# Patient Record
Sex: Male | Born: 1955
Health system: Southern US, Community
[De-identification: ages and names within clinical notes are randomized; demographics above are authoritative.]

## PROBLEM LIST (undated history)

## (undated) DIAGNOSIS — Z860100 Personal history of colon polyps, unspecified: Secondary | ICD-10-CM

## (undated) DIAGNOSIS — T7840XA Allergy, unspecified, initial encounter: Secondary | ICD-10-CM

## (undated) HISTORY — DX: Personal history of colonic polyps: Z86.010

## (undated) HISTORY — DX: Personal history of colon polyps, unspecified: Z86.0100

## (undated) HISTORY — DX: Allergy, unspecified, initial encounter: T78.40XA

---

## 2008-04-25 HISTORY — PX: KNEE ARTHROSCOPY W/ ACL RECONSTRUCTION: SHX1858

## 2017-03-08 DIAGNOSIS — Z23 Encounter for immunization: Secondary | ICD-10-CM | POA: Diagnosis not present

## 2017-08-08 ENCOUNTER — Encounter: Payer: Self-pay | Admitting: Gastroenterology

## 2017-08-15 ENCOUNTER — Ambulatory Visit (INDEPENDENT_AMBULATORY_CARE_PROVIDER_SITE_OTHER): Payer: 59 | Admitting: Gastroenterology

## 2017-08-15 ENCOUNTER — Encounter: Payer: Self-pay | Admitting: Gastroenterology

## 2017-08-15 VITALS — BP 128/82 | HR 68 | Ht 74.0 in | Wt 280.0 lb

## 2017-08-15 DIAGNOSIS — Z1211 Encounter for screening for malignant neoplasm of colon: Secondary | ICD-10-CM | POA: Diagnosis not present

## 2017-08-15 DIAGNOSIS — K589 Irritable bowel syndrome without diarrhea: Secondary | ICD-10-CM | POA: Diagnosis not present

## 2017-08-15 DIAGNOSIS — Z86010 Personal history of colonic polyps: Secondary | ICD-10-CM

## 2017-08-15 MED ORDER — SOD PICOSULFATE-MAG OX-CIT ACD 10-3.5-12 MG-GM -GM/160ML PO SOLN: 1.0000 | Freq: Once | ORAL | 0 refills | Status: AC

## 2017-08-15 NOTE — Progress Notes (Signed)
Chief Complaint: For colorectal cancer screening  Referring Provider:  No ref. provider found      ASSESSMENT AND PLAN;  Colorectal cancer screening H/O polyps IBS with bloating, loud borborygmi and occ diarrhea, failed trial of probiotics  Plan -Proceed with colonoscopy.  I have discussed the risks and benefits.  The risks including risk of perforation requiring laparotomy, bleeding after polypectomy requiring blood transfusions and risks of anesthesia/sedation were discussed.  Rare risks of missing colorectal neoplasms were also discussed.  Alternatives were given.  Patient is fully aware and agrees to proceed. All the questions were answered. Colonoscopy will be scheduled in upcoming days.  Patient is to report immediately if there is any significant weight loss or excessive bleeding until then. Consent forms were given for review.  -Trial of lactose free and coffee free diet x 2 weeks, if still with problems, trial of gluten free diet x 2 weeks.  If still with problems, will give him a trial of Bentyl or Levsin.  I have reassured the patient.  -Obtain results-of last colonoscopy from [place] (awaiting record deconversion)     HPI:    71yr for colon cancer screening.  No nausea, vomiting, heartburn, regurgitation, odynophagia or dysphagia.  No significant diarrhea or constipation.  There is no melena or hematochezia. No unintentional weight loss.   Has been having "roaring" with "water rumbling" after eating intermittently. Lot of gas. 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 Happens at night as well. Has occ diarrhea. Tried probiotics but did not help. His normal bowel habits - 2/day. The loud borborygmi gets better with defecation.  He has been drinking significant coffee.  No sodas, chewing gums or chocolates.  No nausea, vomiting, heartburn, regurgitation, odynophagia or dysphagia.  No significant constipation.  There is no melena or  hematochezia. No unintentional weight loss.   Past Medical History:  Diagnosis Date  . History of colon polyps     History reviewed. No pertinent surgical history.  Family History  Problem Relation Age of Onset  . Colon cancer Neg Hx     Social History   Tobacco Use  . Smoking status: Never Smoker  . Smokeless tobacco: Never Used  Substance Use Topics  . Alcohol use: Never    Frequency: Never  . Drug use: Never    No current outpatient medications on file.   No current facility-administered medications for this visit.     No Known Allergies  Review of Systems:  Constitutional: Denies fever, chills, diaphoresis, appetite change and fatigue.  HEENT: Denies photophobia, eye pain, redness, hearing loss, ear pain, congestion, sore throat, rhinorrhea, sneezing, mouth sores, neck pain, neck stiffness and tinnitus.   Respiratory: Denies SOB, DOE, cough, chest tightness,  and wheezing.   Cardiovascular: Denies chest pain, palpitations and leg swelling.  Genitourinary: Denies dysuria, urgency, frequency, hematuria, flank pain and difficulty urinating.  Musculoskeletal: Denies myalgias, back pain, joint swelling, arthralgias and gait problem.  Skin: No rash. .  Neurological: Denies dizziness, seizures, syncope, weakness, light-headedness, numbness and headaches.  Hematological: Denies adenopathy. Easy bruising, personal or family bleeding history  Psychiatric/Behavioral: No anxiety or depression     Physical Exam:    BP 128/82   Pulse 68   Ht 6\' 2"  (1.88 m)   Wt 280 lb (127 kg)   BMI 35.95 kg/m  Filed Weights   08/15/17 1352  Weight: 280 lb (127 kg)   Constitutional:  Well-developed, in no  acute distress. Psychiatric: Normal mood and affect. Behavior is normal. HEENT: Pupils normal.  Conjunctivae are normal. No scleral icterus. Neck supple.  Cardiovascular: Normal rate, regular rhythm. No edema Pulmonary/chest: Effort normal and breath sounds normal. No wheezing,  rales or rhonchi. Abdominal: Soft, nondistended. Nontender. Bowel sounds active throughout. There are no masses palpable. No hepatomegaly. Rectal: At the time of colonoscopy Neurological: Alert and oriented to person place and time. Skin: Skin is warm and dry. No rashes noted.    Carmell Austria, MD   Cc: No ref. provider found

## 2017-08-15 NOTE — Patient Instructions (Signed)
If you are age 60 or older, your body mass index should be between 23-30. Your Body mass index is 35.95 kg/m. If this is out of the aforementioned range listed, please consider follow up with your Primary Care Provider.  If you are age 40 or younger, your body mass index should be between 19-25. Your Body mass index is 35.95 kg/m. If this is out of the aformentioned range listed, please consider follow up with your Primary Care Provider.   You have been scheduled for a colonoscopy. Please follow written instructions given to you at your visit today.  Please pick up your prep supplies at the pharmacy within the next 1-3 days. If you use inhalers (even only as needed), please bring them with you on the day of your procedure. Your physician has requested that you go to www.startemmi.com and enter the access code given to you at your visit today. This web site gives a general overview about your procedure. However, you should still follow specific instructions given to you by our office regarding your preparation for the procedure.   Gluten-Free Diet  Adult The gluten-free diet includes all foods that do not contain gluten. Gluten is a protein that is found in wheat, rye, barley, and some other grains. Following the gluten-free diet is the only treatment for people with celiac disease. It helps to prevent damage to the intestines and improves or eliminates the symptoms of celiac disease. Following the gluten-free diet requires some planning. It can be challenging at first, but it gets easier with time and practice. There are more gluten-free options available today than ever before. If you need help finding gluten-free foods or if you have questions, talk with your diet and nutrition specialist (registered dietitian) or your health care provider. What do I need to know about a gluten-free diet?  All fruits, vegetables, and meats are safe to eat and do not contain gluten.  When grocery shopping, start  by shopping in the produce, meat, and dairy sections. These sections are more likely to contain gluten-free foods. Then move to the aisles that contain packaged foods if you need to.  Read all food labels. Gluten is often added to foods. Always check the ingredient list and look for warnings, such as "may contain gluten."  Talk with your dietitian or health care provider before taking a gluten-free multivitamin or mineral supplement.  Be aware of gluten-free foods having contact with foods that contain gluten (cross-contamination). This can happen at home and with any processed foods. ? Talk with your health care provider or dietitian about how to reduce the risk of cross-contamination in your home. ? If you have questions about how a food is processed, ask the manufacturer. What key words help to identify gluten? Foods that list any of these key words on the label usually contain gluten:  Wheat, flour, enriched flour, bromated flour, white flour, durum flour, graham flour, phosphated flour, self-rising flour, semolina, farina, barley (malt), rye, and oats.  Starch, dextrin, modified food starch, or cereal.  Thickening, fillers, or emulsifiers.  Malt flavoring, malt extract, or malt syrup.  Hydrolyzed vegetable protein.  In the [place] packaged foods that are gluten-free are required to be labeled "GF." These foods should be easy to identify and are safe to eat. In the [place] food companies are also required to list common food allergens, including wheat, on their labels. Recommended foods Grains  Amaranth, bean flours, 100% buckwheat flour, corn, millet, nut flours or nut meals, GF oats,  quinoa, rice, sorghum, teff, rice wafers, pure cornmeal tortillas, popcorn, and hot cereals made from cornmeal. Hominy, rice, wild rice. Some Asian rice noodles or bean noodles. Arrowroot starch, corn bran, corn flour, corn germ, cornmeal, corn starch, potato flour, potato starch flour, and rice bran. Plain,  brown, and sweet rice flours. Rice polish, soy flour, and tapioca starch. Vegetables  All plain fresh, frozen, and canned vegetables. Fruits  All plain fresh, frozen, canned, and dried fruits, and 100% fruit juices. Meats and other protein foods  All fresh beef, pork, poultry, fish, seafood, and eggs. Fish canned in water, oil, brine, or vegetable broth. Plain nuts and seeds, peanut butter. Some lunch meat and some frankfurters. Dried beans, dried peas, and lentils. Dairy  Fresh plain, dry, evaporated, or condensed milk. Cream, butter, sour cream, whipping cream, and most yogurts. Unprocessed cheese, most processed cheeses, some cottage cheese, some cream cheeses. Beverages  Coffee, tea, most herbal teas. Carbonated beverages and some root beers. Wine, sake, and distilled spirits, such as gin, vodka, and whiskey. Most hard ciders. Fats and oils  Butter, margarine, vegetable oil, hydrogenated butter, olive oil, shortening, lard, cream, and some mayonnaise. Some commercial salad dressings. Olives. Sweets and desserts  Sugar, honey, some syrups, molasses, jelly, and jam. Plain hard candy, marshmallows, and gumdrops. Pure cocoa powder. Plain chocolate. Custard and some pudding mixes. Gelatin desserts, sorbets, frozen ice pops, and sherbet. Cake, cookies, and other desserts prepared with allowed flours. Some commercial ice creams. Cornstarch, tapioca, and rice puddings. Seasoning and other foods  Some canned or frozen soups. Monosodium glutamate (MSG). Cider, rice, and wine vinegar. Baking soda and baking powder. Cream of tartar. Baking and nutritional yeast. Certain soy sauces made without wheat (ask your dietitian about specific brands that are allowed). Nuts, coconut, and chocolate. Salt, pepper, herbs, spices, flavoring extracts, imitation or artificial flavorings, natural flavorings, and food colorings. Some medicines and supplements. Some lip glosses and other cosmetics. Rice syrups. The  items listed may not be a complete list. Talk with your dietitian about what dietary choices are best for you. Foods to avoid Grains  Barley, bran, bulgur, couscous, cracked wheat, [place], farro, graham, malt, matzo, semolina, wheat germ, and all wheat and rye cereals including spelt and kamut. Cereals containing malt as a flavoring, such as rice cereal. Noodles, spaghetti, macaroni, most packaged rice mixes, and all mixes containing wheat, rye, barley, or triticale. Vegetables  Most creamed vegetables and most vegetables canned in sauces. Some commercially prepared vegetables and salads. Fruits  Thickened or prepared fruits and some pie fillings. Some fruit snacks and fruit roll-ups. Meats and other protein foods  Any meat or meat alternative containing wheat, rye, barley, or gluten stabilizers. These are often marinated or packaged meats and lunch meats. Bread-containing products, such as Swiss steak, croquettes, meatballs, and meatloaf. Most tuna canned in vegetable broth and [place] with hydrolyzed vegetable protein (HVP) injected as part of the basting. Seitan. Imitation fish. Eggs in sauces made from ingredients to avoid. Dairy  Commercial chocolate milk drinks and malted milk. Some non-dairy creamers. Any cheese product containing ingredients to avoid. Beverages  Certain cereal beverages. Beer, ale, malted milk, and some root beers. Some hard ciders. Some instant flavored coffees. Some herbal teas made with barley or with barley malt added. Fats and oils  Some commercial salad dressings. Sour cream containing modified food starch. Sweets and desserts  Some toffees. Chocolate-coated nuts (may be rolled in wheat flour) and some commercial candies and candy bars. Most cakes, cookies,  donuts, pastries, and other baked goods. Some commercial ice cream. Ice cream cones. Commercially prepared mixes for cakes, cookies, and other desserts. Bread pudding and other puddings thickened with flour.  Products containing brown rice syrup made with barley malt enzyme. Desserts and sweets made with malt flavoring. Seasoning and other foods  Some curry powders, some dry seasoning mixes, some gravy extracts, some meat sauces, some ketchups, some prepared mustards, and horseradish. Certain soy sauces. Malt vinegar. Bouillon and bouillon cubes that contain HVP. Some chip dips, and some chewing gum. Yeast extract. Brewer's yeast. Caramel color. Some medicines and supplements. Some lip glosses and other cosmetics. The items listed may not be a complete list. Talk with your dietitian about what dietary choices are best for you. Summary  Gluten is a protein that is found in wheat, rye, barley, and some other grains. The gluten-free diet includes all foods that do not contain gluten.  If you need help finding gluten-free foods or if you have questions, talk with your diet and nutrition specialist (registered dietitian) or your health care provider.  Read all food labels. Gluten is often added to foods. Always check the ingredient list and look for warnings, such as "may contain gluten." This information is not intended to replace advice given to you by your health care provider. Make sure you discuss any questions you have with your health care provider. Document Released: 04/11/2005 Document Revised: 01/25/2016 Document Reviewed: 01/25/2016 Elsevier Interactive Patient Education  2018 [employer].

## 2017-08-16 ENCOUNTER — Encounter: Payer: Self-pay | Admitting: Gastroenterology

## 2017-08-30 ENCOUNTER — Other Ambulatory Visit (INDEPENDENT_AMBULATORY_CARE_PROVIDER_SITE_OTHER): Payer: 59

## 2017-08-30 ENCOUNTER — Telehealth: Payer: Self-pay

## 2017-08-30 ENCOUNTER — Encounter: Payer: Self-pay | Admitting: Gastroenterology

## 2017-08-30 ENCOUNTER — Other Ambulatory Visit: Payer: Self-pay

## 2017-08-30 ENCOUNTER — Ambulatory Visit (AMBULATORY_SURGERY_CENTER): Payer: 59 | Admitting: Gastroenterology

## 2017-08-30 VITALS — BP 128/77 | HR 74 | Temp 99.1°F | Resp 15 | Ht 74.0 in | Wt 280.0 lb

## 2017-08-30 DIAGNOSIS — Z86010 Personal history of colonic polyps: Secondary | ICD-10-CM | POA: Diagnosis present

## 2017-08-30 DIAGNOSIS — K635 Polyp of colon: Secondary | ICD-10-CM

## 2017-08-30 DIAGNOSIS — K6389 Other specified diseases of intestine: Secondary | ICD-10-CM

## 2017-08-30 DIAGNOSIS — K639 Disease of intestine, unspecified: Secondary | ICD-10-CM

## 2017-08-30 DIAGNOSIS — D123 Benign neoplasm of transverse colon: Secondary | ICD-10-CM | POA: Diagnosis not present

## 2017-08-30 DIAGNOSIS — D125 Benign neoplasm of sigmoid colon: Secondary | ICD-10-CM

## 2017-08-30 DIAGNOSIS — R197 Diarrhea, unspecified: Secondary | ICD-10-CM | POA: Diagnosis not present

## 2017-08-30 LAB — COMPREHENSIVE METABOLIC PANEL
ALK PHOS: 63 U/L (ref 39–117)
ALT: 11 U/L (ref 0–53)
AST: 12 U/L (ref 0–37)
Albumin: 3.6 g/dL (ref 3.5–5.2)
BILIRUBIN TOTAL: 0.6 mg/dL (ref 0.2–1.2)
BUN: 9 mg/dL (ref 6–23)
CO2: 35 mEq/L — ABNORMAL HIGH (ref 19–32)
CREATININE: 1.01 mg/dL (ref 0.40–1.50)
Calcium: 9.3 mg/dL (ref 8.4–10.5)
Chloride: 98 mEq/L (ref 96–112)
GFR: 80.07 mL/min (ref >60.00)
GLUCOSE: 120 mg/dL — AB (ref 70–99)
Potassium: 3.9 mEq/L (ref 3.5–5.1)
Sodium: 142 mEq/L (ref 135–145)
Total Protein: 6.3 g/dL (ref 6.0–8.3)

## 2017-08-30 LAB — CBC WITH DIFFERENTIAL/PLATELET
BASOS ABS: 0.1 10*3/uL (ref 0.0–0.1)
Basophils Relative: 0.9 % (ref 0.0–3.0)
EOS ABS: 0.1 10*3/uL (ref 0.0–0.7)
Eosinophils Relative: 1.6 % (ref 0.0–5.0)
HCT: 39 % (ref 39.0–52.0)
Hemoglobin: 12.7 g/dL — ABNORMAL LOW (ref 13.0–17.0)
LYMPHS ABS: 0.3 10*3/uL — AB (ref 0.7–4.0)
LYMPHS PCT: 4.3 % — AB (ref 12.0–46.0)
MCHC: 32.6 g/dL (ref 30.0–36.0)
MCV: 76.8 fl — AB (ref 78.0–100.0)
MONOS PCT: 8.1 % (ref 3.0–12.0)
Monocytes Absolute: 0.5 10*3/uL (ref 0.1–1.0)
Neutro Abs: 5.7 10*3/uL (ref 1.4–7.7)
Platelets: 245 10*3/uL (ref 150.0–400.0)
RBC: 5.08 Mil/uL (ref 4.22–5.81)
RDW: 16.6 % — ABNORMAL HIGH (ref 11.5–15.5)
WBC: 6.7 10*3/uL (ref 4.0–10.5)

## 2017-08-30 MED ORDER — SODIUM CHLORIDE 0.9 % IV SOLN: 500.0000 mL | Freq: Once | INTRAVENOUS | Status: DC

## 2017-08-30 NOTE — Progress Notes (Signed)
Pt's states no medical or surgical changes since previsit or office visit. 

## 2017-08-30 NOTE — Progress Notes (Signed)
To recovery, report to RN, VSS. 

## 2017-08-30 NOTE — Telephone Encounter (Signed)
Orders for CBC, CMP and CEA placed per order of Dr Lyndel Safe.  Patient to go to the lab after leaving the [place] [facility].  Order also placed for CT C/A/P with contrast.  Scheduled for 09/05/17 at 8:30 am, NPO 4 hours prior to test.  First contrast at 6:30 am and second at 7:30 am.  [facility] provided him with 2 bottles of contrast.  He was given written instructions for the CT test.

## 2017-08-30 NOTE — Op Note (Signed)
[facility] Patient Name: Nicholas Mora Procedure Date: 08/30/2017 9:27 AM MRN: [id number] Endoscopist: Jackquline Denmark MD, MD Age: 62 Referring MD:  Date of Birth: Nov 21, 1955 Gender: Male Account #: [id number] Procedure:                Colonoscopy Indications:              Screening for colorectal malignant neoplasm. Pt                            with H/O diarrhea and loud boryborgymi. Medicines:                Monitored Anesthesia Care Procedure:                Pre-Anesthesia Assessment:                           - Prior to the procedure, a History and Physical                            was performed, and patient medications and                            allergies were reviewed. The patient is competent.                            The risks and benefits of the procedure and the                            sedation options and risks were discussed with the                            patient. All questions were answered and informed                            consent was obtained. Patient identification and                            proposed procedure were verified by the physician                            in the procedure room. Mental Status Examination:                            alert and oriented. Prophylactic Antibiotics: The                            patient does not require prophylactic antibiotics.                            Prior Anticoagulants: The patient has taken no                            previous anticoagulant or antiplatelet agents. ASA  Grade Assessment: III - A patient with severe                            systemic disease. After reviewing the risks and                            benefits, the patient was deemed in satisfactory                            condition to undergo the procedure. The anesthesia                            plan was to use monitored anesthesia care (MAC).                            Immediately prior to  administration of medications,                            the patient was re-assessed for adequacy to receive                            sedatives. The heart rate, respiratory rate, oxygen                            saturations, blood pressure, adequacy of pulmonary                            ventilation, and response to care were monitored                            throughout the procedure. The physical status of                            the patient was re-assessed after the procedure.                           After obtaining informed consent, the colonoscope                            was passed under direct vision. Throughout the                            procedure, the patient's blood pressure, pulse, and                            oxygen saturations were monitored continuously. The                            Colonoscope was introduced through the anus and                            advanced to the the hepatic flexure. The  colonoscopy was somewhat difficult due to a                            tortuous colon. The patient tolerated the procedure                            well. The quality of the bowel preparation was fair. Scope In: 9:31:43 AM Scope Out: 10:02:20 AM Scope Withdrawal Time: 0 hours 16 minutes 37 seconds  Total Procedure Duration: 0 hours 30 minutes 37 seconds  Findings:                 A polypoid medium-sized, ulcerated, friable, mass                            was found at the hepatic flexure measuring                            approximately 4 cm. The mass was circumferential.                            The adult colonoscope could not be passed beyond                            due to the mass and also due to highly redundant                            colon. This was biopsied with a cold forceps for                            histology. Area was tattooed with an injection of 3                            mL of [place] ink.                            A 4 mm polyp was found in the sigmoid colon. The                            polyp was sessile. The polyp was removed with a                            cold snare. Resection and retrieval were complete.                           Multiple small-mouthed diverticula were found in                            the sigmoid colon. Complications:            No immediate complications. Estimated Blood Loss:     Estimated blood loss was minimal. Impression:               - Hepatic flexure mass - biopsied and tattooed.                           -  Small sigmoid colon s/p polypectomy.                           - Moderate sigmoid diverticulosis. Recommendation:           - Resume previous diet.                           - Continue present medications.                           - Await pathology results.                           - Perform CT scan (computed tomography) of the                            chest/abdomen/pelvis with by mouth and IV contrast.                           - Check CBC, CMP and a CEA level.                           - He would need surgical consultation that after. Jackquline Denmark MD, MD 08/30/2017 10:24:42 AM This report has been signed electronically.

## 2017-08-30 NOTE — Progress Notes (Signed)
Called to room to assist during endoscopic procedure.  Patient ID and intended procedure confirmed with present staff. Received instructions for my participation in the procedure from the performing physician.  

## 2017-08-30 NOTE — Patient Instructions (Signed)
CT CONTRAST GIVEN AND INSTRUCTIONS.  YOU HAD AN ENDOSCOPIC PROCEDURE TODAY AT THE [facility]:   Refer to the procedure report that was given to you for any specific questions about what was found during the examination.  If the procedure report does not answer your questions, please call your gastroenterologist to clarify.  If you requested that your care partner not be given the details of your procedure findings, then the procedure report has been included in a sealed envelope for you to review at your convenience later.  YOU SHOULD EXPECT: Some feelings of bloating in the abdomen. Passage of more gas than usual.  Walking can help get rid of the air that was put into your GI tract during the procedure and reduce the bloating. If you had a lower endoscopy (such as a colonoscopy or flexible sigmoidoscopy) you may notice spotting of blood in your stool or on the toilet paper. If you underwent a bowel prep for your procedure, you may not have a normal bowel movement for a few days.  Please Note:  You might notice some irritation and congestion in your nose or some drainage.  This is from the oxygen used during your procedure.  There is no need for concern and it should clear up in a day or so.  SYMPTOMS TO REPORT IMMEDIATELY:   Following lower endoscopy (colonoscopy or flexible sigmoidoscopy):  Excessive amounts of blood in the stool  Significant tenderness or worsening of abdominal pains  Swelling of the abdomen that is new, acute  Fever of 100F or higher For urgent or emergent issues, a gastroenterologist can be reached at any hour by calling [phone].   DIET:  We do recommend a small meal at first, but then you may proceed to your regular diet.  Drink plenty of fluids but you should avoid alcoholic beverages for 24 hours.  ACTIVITY:  You should plan to take it easy for the rest of today and you should NOT DRIVE or use heavy machinery until tomorrow (because of the sedation  medicines used during the test).    FOLLOW UP: Our staff will call the number listed on your records the next business day following your procedure to check on you and address any questions or concerns that you may have regarding the information given to you following your procedure. If we do not reach you, we will leave a message.  However, if you are feeling well and you are not experiencing any problems, there is no need to return our call.  We will assume that you have returned to your regular daily activities without incident.  If any biopsies were taken you will be contacted by phone or by letter within the next 1-3 weeks.  Please call us at [phone] if you have not heard about the biopsies in 3 weeks.    SIGNATURES/CONFIDENTIALITY: You and/or your care partner have signed paperwork which will be entered into your electronic medical record.  These signatures attest to the fact that that the information above on your After Visit Summary has been reviewed and is understood.  Full responsibility of the confidentiality of this discharge information lies with you and/or your care-partner.

## 2017-08-31 ENCOUNTER — Telehealth: Payer: Self-pay | Admitting: *Deleted

## 2017-08-31 NOTE — Telephone Encounter (Signed)
  Follow up Call-  Call back number 08/30/2017  Post procedure Call Back phone  # [phone]  Permission to leave phone message Yes     Patient questions:  Do you have a fever, pain , or abdominal swelling? No. Pain Score  0 *  Have you tolerated food without any problems? Yes.    Have you been able to return to your normal activities? Yes.    Do you have any questions about your discharge instructions: Diet   No. Medications  No. Follow up visit  No.  Do you have questions or concerns about your Care? No.  Actions: * If pain score is 4 or above: No action needed, pain <4.

## 2017-09-01 ENCOUNTER — Telehealth: Payer: Self-pay | Admitting: Gastroenterology

## 2017-09-01 LAB — CEA: CEA: 0.5 ng/mL

## 2017-09-01 NOTE — Telephone Encounter (Signed)
I have talked to the patient in detail over the phone.  Gone over the biopsy results.  He will decide about the surgeon and let us know.  He is scheduled to have a CT scan chest abdomen and pelvis next week.

## 2017-09-05 ENCOUNTER — Ambulatory Visit (HOSPITAL_COMMUNITY)
Admission: RE | Admit: 2017-09-05 | Discharge: 2017-09-05 | Disposition: A | Discharge status: Home / Self Care | Payer: 59 | Source: Ambulatory Visit | Attending: Gastroenterology | Admitting: Gastroenterology

## 2017-09-05 DIAGNOSIS — R188 Other ascites: Secondary | ICD-10-CM | POA: Insufficient documentation

## 2017-09-05 DIAGNOSIS — K5730 Diverticulosis of large intestine without perforation or abscess without bleeding: Secondary | ICD-10-CM | POA: Diagnosis not present

## 2017-09-05 DIAGNOSIS — J90 Pleural effusion, not elsewhere classified: Secondary | ICD-10-CM | POA: Diagnosis not present

## 2017-09-05 DIAGNOSIS — E042 Nontoxic multinodular goiter: Secondary | ICD-10-CM | POA: Diagnosis not present

## 2017-09-05 DIAGNOSIS — N200 Calculus of kidney: Secondary | ICD-10-CM | POA: Insufficient documentation

## 2017-09-05 DIAGNOSIS — K5750 Diverticulosis of both small and large intestine without perforation or abscess without bleeding: Secondary | ICD-10-CM | POA: Insufficient documentation

## 2017-09-05 DIAGNOSIS — K639 Disease of intestine, unspecified: Secondary | ICD-10-CM | POA: Insufficient documentation

## 2017-09-05 DIAGNOSIS — K6389 Other specified diseases of intestine: Secondary | ICD-10-CM

## 2017-09-05 DIAGNOSIS — I2510 Atherosclerotic heart disease of native coronary artery without angina pectoris: Secondary | ICD-10-CM | POA: Diagnosis not present

## 2017-09-05 DIAGNOSIS — E041 Nontoxic single thyroid nodule: Secondary | ICD-10-CM | POA: Diagnosis not present

## 2017-09-05 MED ORDER — IOPAMIDOL (ISOVUE-300) INJECTION 61%
INTRAVENOUS | Status: AC
  Filled 2017-09-05: qty 100

## 2017-09-05 MED ORDER — IOPAMIDOL (ISOVUE-300) INJECTION 61%
100.0000 mL | Freq: Once | INTRAVENOUS | Status: AC | PRN
  Administered 2017-09-05: 100 mL via INTRAVENOUS

## 2017-09-06 ENCOUNTER — Telehealth: Payer: Self-pay | Admitting: Gastroenterology

## 2017-09-06 ENCOUNTER — Other Ambulatory Visit: Payer: Self-pay

## 2017-09-06 NOTE — Telephone Encounter (Signed)
-   PET scan - Refer to Dr Dellis Filbert at [facility] per pt's request.  RE: colon cancer, intussusception with PSBO, small liver lesion and calcified LNs (doubt mets). Normal CEA. For R hemicolectomy. Await PET scan.   Discussed withy pt in detail. Merrie Roof

## 2017-09-06 NOTE — Telephone Encounter (Signed)
Please confirm which PET scan you want ordered.

## 2017-09-07 ENCOUNTER — Other Ambulatory Visit: Payer: Self-pay

## 2017-09-07 ENCOUNTER — Telehealth: Payer: Self-pay

## 2017-09-07 DIAGNOSIS — K6389 Other specified diseases of intestine: Secondary | ICD-10-CM

## 2017-09-07 NOTE — Telephone Encounter (Signed)
PET scan (skull to mid-thigh) is scheduled for 09/08/17 at 3:00 pm at [facility].  He is to be NPO after 9:00 am and should arrive at the hospital by 2:30 pm.

## 2017-09-07 NOTE — Telephone Encounter (Signed)
I called to let him know that he is scheduled to see Dr Renelda Mom on 10/11/17 at 8:30 am.  Her office will send new patient packet.

## 2017-09-08 ENCOUNTER — Encounter (HOSPITAL_COMMUNITY): Payer: 59

## 2017-09-08 NOTE — Telephone Encounter (Signed)
Please let me know the results of the PET scan Also, we may have to get him in sooner to Dr Ashburn's office

## 2017-09-13 ENCOUNTER — Other Ambulatory Visit (HOSPITAL_COMMUNITY): Payer: 59

## 2017-09-13 ENCOUNTER — Telehealth: Payer: Self-pay

## 2017-09-13 ENCOUNTER — Encounter (HOSPITAL_COMMUNITY): Payer: 59

## 2017-09-13 NOTE — Telephone Encounter (Signed)
I just spoke with Amy, the pre-cert coordinator and the PET scan has been denied by insurance.  If you want to do a peer-to-peer, I have the phone number and can set it up for you.  Please advise.  Thank you.

## 2017-09-18 NOTE — Telephone Encounter (Signed)
Pl do as soon as possible Also, pl call him and let me know how he is doing.

## 2017-09-19 ENCOUNTER — Telehealth: Payer: Self-pay

## 2017-09-19 NOTE — Telephone Encounter (Signed)
I spoke with him and he is doing "OK".  He still has the "gurgling" in his stomach and also cramping.  He is asking if there is anything that you could prescribe for these symptoms?  Please advise.  Thank you.

## 2017-09-19 NOTE — Telephone Encounter (Signed)
The information to do the peer to peer was sent to [place] and [place].

## 2017-09-20 ENCOUNTER — Encounter (HOSPITAL_COMMUNITY): Payer: 59

## 2017-09-20 NOTE — Telephone Encounter (Signed)
Peer to peer review  I have talked to the physician at [place] [employer] and they have approved the PET scan for the patient Authorization is [id number],  Please get it done as soon as possible Also if he can move up the appointment with Dr Drue Flirt -they can see him earlier if there is cancellation

## 2017-09-21 ENCOUNTER — Telehealth: Payer: Self-pay

## 2017-09-21 NOTE — Telephone Encounter (Signed)
Thanks for letting me know!

## 2017-09-21 NOTE — Telephone Encounter (Signed)
He is scheduled for the PET scan on 09/26/17 at 2:00 pm at [facility]  Patient is award to arrive at 1:30 pm and to be NPO for 6 hours prior to the test.   He is still asking for something for his stomach, cramping, gurglilng, reports that it is difficult for him to eat or get comfortable.  Please advise.  Thank you.

## 2017-09-22 ENCOUNTER — Telehealth: Payer: Self-pay | Admitting: Gastroenterology

## 2017-09-22 NOTE — Telephone Encounter (Signed)
Pt is requesting to speak with Dr. Lyndel Safe. He said that it is not urgent but wants to let him know that his sxs has worsened regarding abd pain and cramps and he wants to know if he can take anything.

## 2017-09-22 NOTE — Telephone Encounter (Signed)
Dr Lyndel Safe please see note from pt

## 2017-09-25 NOTE — Telephone Encounter (Signed)
Pt's wife said he is in severe pain and "he needs some help"  [phone]

## 2017-09-25 NOTE — Telephone Encounter (Signed)
Called the wife. The patient has waves of pain that he describes as cramps. "Doubles him over." He is having bowel movements daily alternating from loose to formed. Stomach is noisy. Appetite is poor. Not sleeping due to the abdominal discomfort.

## 2017-09-25 NOTE — Telephone Encounter (Signed)
Talked with the patient in detail over the phone. PET scan tomorrow Has appointment with Dr. Drue Flirt for June 16th

## 2017-09-26 ENCOUNTER — Encounter (HOSPITAL_COMMUNITY)
Admission: RE | Admit: 2017-09-26 | Discharge: 2017-09-26 | Disposition: A | Discharge status: Home / Self Care | Payer: 59 | Source: Ambulatory Visit | Attending: Gastroenterology | Admitting: Gastroenterology

## 2017-09-26 DIAGNOSIS — K6389 Other specified diseases of intestine: Secondary | ICD-10-CM

## 2017-09-26 DIAGNOSIS — C189 Malignant neoplasm of colon, unspecified: Secondary | ICD-10-CM | POA: Diagnosis not present

## 2017-09-26 DIAGNOSIS — K639 Disease of intestine, unspecified: Secondary | ICD-10-CM | POA: Diagnosis present

## 2017-09-26 LAB — GLUCOSE, CAPILLARY: GLUCOSE-CAPILLARY: 115 mg/dL — AB (ref 65–99)

## 2017-09-26 MED ORDER — FLUDEOXYGLUCOSE F - 18 (FDG) INJECTION
13.4000 | Freq: Once | INTRAVENOUS | Status: AC | PRN
  Administered 2017-09-26: 13.4 via INTRAVENOUS

## 2017-09-27 ENCOUNTER — Other Ambulatory Visit: Payer: Self-pay

## 2017-09-27 ENCOUNTER — Telehealth: Payer: Self-pay

## 2017-09-27 DIAGNOSIS — K6389 Other specified diseases of intestine: Secondary | ICD-10-CM

## 2017-09-27 NOTE — Telephone Encounter (Signed)
Referral has been made to Dr Marin Olp in [facility] and a copy of the PET scan result was faxed to Dr Ashburn's office at [facility].

## 2017-10-04 ENCOUNTER — Other Ambulatory Visit: Payer: Self-pay

## 2017-10-04 ENCOUNTER — Inpatient Hospital Stay: Payer: 59 | Attending: Hematology & Oncology | Admitting: Hematology & Oncology

## 2017-10-04 ENCOUNTER — Inpatient Hospital Stay: Payer: 59

## 2017-10-04 VITALS — BP 141/96 | HR 97 | Temp 98.2°F | Resp 18 | Wt 265.0 lb

## 2017-10-04 DIAGNOSIS — K6389 Other specified diseases of intestine: Secondary | ICD-10-CM | POA: Diagnosis not present

## 2017-10-04 DIAGNOSIS — C183 Malignant neoplasm of hepatic flexure: Secondary | ICD-10-CM

## 2017-10-04 DIAGNOSIS — M859 Disorder of bone density and structure, unspecified: Secondary | ICD-10-CM | POA: Insufficient documentation

## 2017-10-04 DIAGNOSIS — R1900 Intra-abdominal and pelvic swelling, mass and lump, unspecified site: Secondary | ICD-10-CM | POA: Insufficient documentation

## 2017-10-04 LAB — CMP (CANCER CENTER ONLY)
ALT: 24 U/L (ref 10–47)
AST: 23 U/L (ref 11–38)
Albumin: 3.3 g/dL — ABNORMAL LOW (ref 3.5–5.0)
Alkaline Phosphatase: 84 U/L (ref 26–84)
Anion gap: 11 (ref 5–15)
BUN: 20 mg/dL (ref 7–22)
CO2: 28 mmol/L (ref 18–33)
Calcium: 9.3 mg/dL (ref 8.0–10.3)
Chloride: 104 mmol/L (ref 98–108)
Creatinine: 0.8 mg/dL (ref 0.60–1.20)
Glucose, Bld: 114 mg/dL (ref 73–118)
Potassium: 4.6 mmol/L (ref 3.3–4.7)
Sodium: 143 mmol/L (ref 128–145)
Total Bilirubin: 0.8 mg/dL (ref 0.2–1.6)
Total Protein: 6.3 g/dL — ABNORMAL LOW (ref 6.4–8.1)

## 2017-10-04 LAB — CBC WITH DIFFERENTIAL (CANCER CENTER ONLY)
Basophils Absolute: 0 10*3/uL (ref 0.0–0.1)
Basophils Relative: 0 %
Eosinophils Absolute: 0.2 10*3/uL (ref 0.0–0.5)
Eosinophils Relative: 2 %
HCT: 38.8 % (ref 38.7–49.9)
Hemoglobin: 12.5 g/dL — ABNORMAL LOW (ref 13.0–17.1)
Lymphocytes Relative: 6 %
Lymphs Abs: 0.5 10*3/uL — ABNORMAL LOW (ref 0.9–3.3)
MCH: 25.1 pg — ABNORMAL LOW (ref 28.0–33.4)
MCHC: 32.2 g/dL (ref 32.0–35.9)
MCV: 77.8 fL — ABNORMAL LOW (ref 82.0–98.0)
Monocytes Absolute: 0.8 10*3/uL (ref 0.1–0.9)
Monocytes Relative: 8 %
Neutro Abs: 7.7 10*3/uL — ABNORMAL HIGH (ref 1.5–6.5)
Neutrophils Relative %: 84 %
Platelet Count: 279 10*3/uL (ref 145–400)
RBC: 4.99 MIL/uL (ref 4.20–5.70)
RDW: 16.6 % — ABNORMAL HIGH (ref 11.1–15.7)
WBC Count: 9.1 10*3/uL (ref 4.0–10.0)

## 2017-10-04 NOTE — Progress Notes (Signed)
Referral MD  Reason for Referral: Colonic mass at the hepatic flexure; abnormal PET scan  Chief Complaint  Patient presents with  . New Patient (Initial Visit)  : I may have cancer in my colon.  HPI: Nicholas Mora is a very nice 62 year old white male.  He comes in with his wife.  He lives in [place].  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  He has been having some abdominal discomfort.  He has been having a lot of abdominal discomfort.  There is been no vomiting.  He has had no diarrhea.  He has had no hematochezia or melena.  He was referred to Dr. Lyndel Safe of gastroenterology.  Dr. Lyndel Safe did a colonoscopy on him.  This was done on Aug 31, 2017.  A 4 cm friable mass was noted at the back of the flexor.  This was circumferential.  The scope could not be passed through this.  Biopsies were taken.  The pathology report ([id number]) showed a tubulovillous adenoma with high-grade dysplasia.  Invasion cannot be excluded.  The sigmoid polyp was a tubular adenoma.  Scans were then done.  A CT scan was done on Sep 05, 2017.  This showed suspected ileo-ileal and ileocolic intussusception with mesenteric edema.  There is calcified adenopathy in the mesentery.  There is some thickening of the ascending colon.  The radiologist was concerned about lymphoma, mucinous adenocarcinoma or carcinoid.  There was calcified adenopathy in the chest with peri-bronchovascular calcifications and nodularity in the lungs.  Of course, the radiologist gave an extensive list of possibilities.  A PET scan was then done.  This was done on 09/26/2017.  There was noted to be a 4.1 cm calcified mass in the right lower quadrant worrisome for mesenteric carcinoid.  There is hyper metabolic activity at the hepatic flexure.  There is hypermetabolic calcified thoracic lymph nodes.  There are multifocal metabolic osseous lesions.  He is seeing a Psychologist, sport and exercise at [facility] next week.  He was referred to the [place] [facility] for our recommendations.  He had a CEA level back in May of 0.5.  He looks great.  He is lost a little bit of weight.  He has had no fever.  He has had no rashes.  He used to grow up on a farm.  He has exposures on the farm.  He has had no bleeding.  His appetite is good.  There is no real history of the family of cancer.  He does not smoke.  He does not drink.  Overall, his performance status is ECOG 0.   Past Medical History:  Diagnosis Date  . Allergy   . History of colon polyps   :  Past Surgical History:  Procedure Laterality Date  . KNEE ARTHROSCOPY W/ ACL RECONSTRUCTION Left 2010   left knee - mcl   :  No current outpatient medications on file.  Current Facility-Administered Medications:  .  0.9 %  sodium chloride infusion, 500 mL, Intravenous, Once, Nicholas Denmark, MD:  :  No Known Allergies:  Family History  Problem Relation Age of Onset  . Stomach cancer Father   . Colon cancer Neg Hx   . Rectal cancer Neg Hx   . Esophageal cancer Neg Hx   :  Social History   Socioeconomic History  . Marital status: Married    Spouse name: Not on file  . Number of children: Not on file  . Years of education: Not on  file  . Highest education level: Not on file  Occupational History  . Not on file  Social Needs  . Financial resource strain: Not on file  . Food insecurity:    Worry: Not on file    Inability: Not on file  . Transportation needs:    Medical: Not on file    Non-medical: Not on file  Tobacco Use  . Smoking status: Never Smoker  . Smokeless tobacco: Never Used  Substance and Sexual Activity  . Alcohol use: Never    Frequency: Never  . Drug use: Never  . Sexual activity: Not on file  Lifestyle  . Physical activity:    Days per week: Not on file    Minutes per session: Not on file  . Stress: Not on file  Relationships  . Social connections:    Talks on phone: Not on file    Gets together: Not on file    Attends religious  service: Not on file    Active member of club or organization: Not on file    Attends meetings of clubs or organizations: Not on file    Relationship status: Not on file  . Intimate partner violence:    Fear of current or ex partner: Not on file    Emotionally abused: Not on file    Physically abused: Not on file    Forced sexual activity: Not on file  Other Topics Concern  . Not on file  Social History Narrative  . Not on file  :  Review of Systems  Constitutional: Negative.   HENT: Negative.   Eyes: Negative.   Respiratory: Negative.   Cardiovascular: Negative.   Gastrointestinal: Positive for abdominal pain and nausea.  Genitourinary: Negative.   Musculoskeletal: Negative.   Skin: Negative.   Neurological: Negative.   Endo/Heme/Allergies: Negative.   Psychiatric/Behavioral: Negative.      Exam: Well-developed well-nourished white male in no obvious distress.  Vital signs show a temperature of 98.2.  Pulse 97.  Blood pressure 141/96.  Weight is 265 pounds.  Head neck exam shows no ocular or oral lesions.  There are no palpable cervical or supraclavicular lymph nodes.  Lungs are clear laterally.  Cardiac exam regular rate and rhythm with no murmurs, rubs or bruits.  Abdomen is soft.  Abdomen is mildly obese.  Abdomen is incredibly active with bowel sounds.  There may be a little bit of distention.  There is no fluid wave.  There is some tenderness to palpation in the right side of the abdomen.  No palpable liver edge is noted.  There is no palpable spleen tip.  Back exam shows no tenderness over the spine, ribs or hips.  Extremities shows no clubbing, cyanosis or edema.  He has good range of motion of his joints.  Skin exam shows seborrheic keratoses on his back.  No suspicious hyperpigmented lesions are noted.   @IPVITALS @   Recent Labs    10/04/17 1358  WBC 9.1  HGB 12.5*  HCT 38.8  PLT 279   Recent Labs    10/04/17 1358  NA 143  K 4.6  CL 104  CO2 28  GLUCOSE  114  BUN 20  CREATININE 0.80  CALCIUM 9.3    Blood smear review: None  Pathology: See above    Assessment and Plan: Nicholas Mora is a nice 62 year old white male.  He has a mass at the hepatic flexure.  He has an abnormal PET scan.  Clearly, he needs surgery.  If not, he will definitely develop obstruction.  From the CT scan, it looks like there is quite a bit going on in the ascending colon and hepatic flexure.  I just would be shocked if he had metastatic disease.  These lymph nodes are calcified.  I have to think that these lymph does reflect environmental exposures.  Again he grew up on a farm and had plenty of exposure at that time.  I suppose he may have carcinoid.  If so, this could certainly be treated easily enough.  Again, surgery is what he needs.  He needs to have this area in the hepatic flexure resected.  This will give [place] the pathology.  I just have a hard time believing that the activity in the bones on the PET scan reflect any type of metastatic disease.  This is a very interesting situation.  I spent a good hour with Nicholas Mora and his wife.  All the time was spent face-to-face.  I spent the time counseling them and reviewing all of the labs and x-rays that he has had.  Hopefully, he will have surgery in a couple weeks.  I cannot think of any additional studies that we need to do right now.  I think that surgery will provide enough specimen for [place].

## 2017-10-05 LAB — CEA (IN HOUSE-CHCC): CEA (CHCC-IN HOUSE): 1.05 ng/mL (ref 0.00–5.00)

## 2017-10-05 LAB — IRON AND TIBC
IRON: 36 ug/dL — AB (ref 42–163)
SATURATION RATIOS: 12 % — AB (ref 42–163)
TIBC: 314 ug/dL (ref 202–409)
UIBC: 277 ug/dL

## 2017-10-05 LAB — FERRITIN: FERRITIN: 25 ng/mL (ref 22–316)

## 2017-10-11 DIAGNOSIS — K639 Disease of intestine, unspecified: Secondary | ICD-10-CM | POA: Diagnosis not present

## 2017-10-11 DIAGNOSIS — D3A00 Benign carcinoid tumor of unspecified site: Secondary | ICD-10-CM | POA: Diagnosis not present

## 2017-10-27 DIAGNOSIS — D3A00 Benign carcinoid tumor of unspecified site: Secondary | ICD-10-CM | POA: Diagnosis not present

## 2017-10-27 DIAGNOSIS — K639 Disease of intestine, unspecified: Secondary | ICD-10-CM | POA: Diagnosis not present

## 2017-10-31 DIAGNOSIS — C787 Secondary malignant neoplasm of liver and intrahepatic bile duct: Secondary | ICD-10-CM | POA: Diagnosis not present

## 2017-10-31 DIAGNOSIS — R109 Unspecified abdominal pain: Secondary | ICD-10-CM | POA: Diagnosis not present

## 2017-10-31 DIAGNOSIS — D3A00 Benign carcinoid tumor of unspecified site: Secondary | ICD-10-CM | POA: Diagnosis not present

## 2017-10-31 DIAGNOSIS — C7A021 Malignant carcinoid tumor of the cecum: Secondary | ICD-10-CM | POA: Diagnosis not present

## 2017-10-31 DIAGNOSIS — C182 Malignant neoplasm of ascending colon: Secondary | ICD-10-CM | POA: Diagnosis not present

## 2017-10-31 DIAGNOSIS — C7A012 Malignant carcinoid tumor of the ileum: Secondary | ICD-10-CM | POA: Diagnosis not present

## 2017-10-31 DIAGNOSIS — C7A020 Malignant carcinoid tumor of the appendix: Secondary | ICD-10-CM | POA: Diagnosis not present

## 2017-10-31 DIAGNOSIS — Z9049 Acquired absence of other specified parts of digestive tract: Secondary | ICD-10-CM | POA: Diagnosis not present

## 2017-10-31 DIAGNOSIS — G8918 Other acute postprocedural pain: Secondary | ICD-10-CM | POA: Diagnosis not present

## 2017-10-31 DIAGNOSIS — K639 Disease of intestine, unspecified: Secondary | ICD-10-CM | POA: Diagnosis not present

## 2017-10-31 DIAGNOSIS — C7B02 Secondary carcinoid tumors of liver: Secondary | ICD-10-CM | POA: Diagnosis not present

## 2017-11-13 DIAGNOSIS — L0390 Cellulitis, unspecified: Secondary | ICD-10-CM | POA: Diagnosis not present

## 2017-11-13 DIAGNOSIS — R5382 Chronic fatigue, unspecified: Secondary | ICD-10-CM | POA: Diagnosis not present

## 2017-11-13 DIAGNOSIS — Z299 Encounter for prophylactic measures, unspecified: Secondary | ICD-10-CM | POA: Diagnosis not present

## 2017-11-15 DIAGNOSIS — R600 Localized edema: Secondary | ICD-10-CM | POA: Diagnosis not present

## 2017-11-15 DIAGNOSIS — L0390 Cellulitis, unspecified: Secondary | ICD-10-CM | POA: Diagnosis not present

## 2017-11-15 DIAGNOSIS — D649 Anemia, unspecified: Secondary | ICD-10-CM | POA: Diagnosis not present

## 2017-11-16 DIAGNOSIS — Z79899 Other long term (current) drug therapy: Secondary | ICD-10-CM | POA: Diagnosis not present

## 2017-11-16 DIAGNOSIS — L89154 Pressure ulcer of sacral region, stage 4: Secondary | ICD-10-CM | POA: Diagnosis not present

## 2017-11-16 DIAGNOSIS — C189 Malignant neoplasm of colon, unspecified: Secondary | ICD-10-CM | POA: Diagnosis not present

## 2017-11-17 DIAGNOSIS — D509 Iron deficiency anemia, unspecified: Secondary | ICD-10-CM | POA: Diagnosis not present

## 2017-11-17 DIAGNOSIS — L0390 Cellulitis, unspecified: Secondary | ICD-10-CM | POA: Diagnosis not present

## 2017-11-17 DIAGNOSIS — L0231 Cutaneous abscess of buttock: Secondary | ICD-10-CM | POA: Diagnosis not present

## 2017-11-17 DIAGNOSIS — R600 Localized edema: Secondary | ICD-10-CM | POA: Diagnosis not present

## 2017-11-17 DIAGNOSIS — L03317 Cellulitis of buttock: Secondary | ICD-10-CM | POA: Diagnosis not present

## 2017-11-17 DIAGNOSIS — L89150 Pressure ulcer of sacral region, unstageable: Secondary | ICD-10-CM | POA: Diagnosis not present

## 2017-11-23 DIAGNOSIS — L03317 Cellulitis of buttock: Secondary | ICD-10-CM | POA: Diagnosis not present

## 2017-11-23 DIAGNOSIS — L89150 Pressure ulcer of sacral region, unstageable: Secondary | ICD-10-CM | POA: Diagnosis not present

## 2017-11-23 DIAGNOSIS — L89154 Pressure ulcer of sacral region, stage 4: Secondary | ICD-10-CM | POA: Diagnosis not present

## 2017-11-23 DIAGNOSIS — L0231 Cutaneous abscess of buttock: Secondary | ICD-10-CM | POA: Diagnosis not present

## 2017-11-24 DIAGNOSIS — L0390 Cellulitis, unspecified: Secondary | ICD-10-CM | POA: Diagnosis not present

## 2017-11-24 DIAGNOSIS — Z299 Encounter for prophylactic measures, unspecified: Secondary | ICD-10-CM | POA: Diagnosis not present

## 2017-11-24 DIAGNOSIS — D509 Iron deficiency anemia, unspecified: Secondary | ICD-10-CM | POA: Diagnosis not present

## 2017-11-30 DIAGNOSIS — L89154 Pressure ulcer of sacral region, stage 4: Secondary | ICD-10-CM | POA: Diagnosis not present

## 2017-12-01 DIAGNOSIS — D3A00 Benign carcinoid tumor of unspecified site: Secondary | ICD-10-CM | POA: Diagnosis not present

## 2017-12-07 DIAGNOSIS — L89154 Pressure ulcer of sacral region, stage 4: Secondary | ICD-10-CM | POA: Diagnosis not present

## 2017-12-14 DIAGNOSIS — L89154 Pressure ulcer of sacral region, stage 4: Secondary | ICD-10-CM | POA: Diagnosis not present

## 2017-12-18 DIAGNOSIS — D3A00 Benign carcinoid tumor of unspecified site: Secondary | ICD-10-CM | POA: Diagnosis not present

## 2017-12-21 DIAGNOSIS — L89154 Pressure ulcer of sacral region, stage 4: Secondary | ICD-10-CM | POA: Diagnosis not present

## 2017-12-24 DIAGNOSIS — L89150 Pressure ulcer of sacral region, unstageable: Secondary | ICD-10-CM | POA: Diagnosis not present

## 2017-12-24 DIAGNOSIS — L0231 Cutaneous abscess of buttock: Secondary | ICD-10-CM | POA: Diagnosis not present

## 2017-12-24 DIAGNOSIS — L03317 Cellulitis of buttock: Secondary | ICD-10-CM | POA: Diagnosis not present

## 2017-12-26 DIAGNOSIS — D509 Iron deficiency anemia, unspecified: Secondary | ICD-10-CM | POA: Diagnosis not present

## 2017-12-26 DIAGNOSIS — L0390 Cellulitis, unspecified: Secondary | ICD-10-CM | POA: Diagnosis not present

## 2017-12-26 DIAGNOSIS — Z299 Encounter for prophylactic measures, unspecified: Secondary | ICD-10-CM | POA: Diagnosis not present

## 2018-01-05 DIAGNOSIS — L89154 Pressure ulcer of sacral region, stage 4: Secondary | ICD-10-CM | POA: Diagnosis not present

## 2018-01-09 DIAGNOSIS — J208 Acute bronchitis due to other specified organisms: Secondary | ICD-10-CM | POA: Diagnosis not present

## 2018-01-09 DIAGNOSIS — R030 Elevated blood-pressure reading, without diagnosis of hypertension: Secondary | ICD-10-CM | POA: Diagnosis not present

## 2018-01-09 DIAGNOSIS — D509 Iron deficiency anemia, unspecified: Secondary | ICD-10-CM | POA: Diagnosis not present

## 2018-01-12 DIAGNOSIS — L89154 Pressure ulcer of sacral region, stage 4: Secondary | ICD-10-CM | POA: Diagnosis not present

## 2018-01-15 DIAGNOSIS — R0602 Shortness of breath: Secondary | ICD-10-CM | POA: Diagnosis not present

## 2018-01-15 DIAGNOSIS — R030 Elevated blood-pressure reading, without diagnosis of hypertension: Secondary | ICD-10-CM | POA: Diagnosis not present

## 2018-01-15 DIAGNOSIS — I517 Cardiomegaly: Secondary | ICD-10-CM | POA: Diagnosis not present

## 2018-01-15 DIAGNOSIS — J208 Acute bronchitis due to other specified organisms: Secondary | ICD-10-CM | POA: Diagnosis not present

## 2018-01-15 DIAGNOSIS — D509 Iron deficiency anemia, unspecified: Secondary | ICD-10-CM | POA: Diagnosis not present

## 2018-01-19 DIAGNOSIS — L89154 Pressure ulcer of sacral region, stage 4: Secondary | ICD-10-CM | POA: Diagnosis not present

## 2018-01-22 DIAGNOSIS — J189 Pneumonia, unspecified organism: Secondary | ICD-10-CM | POA: Diagnosis not present

## 2018-01-22 DIAGNOSIS — D509 Iron deficiency anemia, unspecified: Secondary | ICD-10-CM | POA: Diagnosis not present

## 2018-01-22 DIAGNOSIS — R030 Elevated blood-pressure reading, without diagnosis of hypertension: Secondary | ICD-10-CM | POA: Diagnosis not present

## 2018-01-26 DIAGNOSIS — Z09 Encounter for follow-up examination after completed treatment for conditions other than malignant neoplasm: Secondary | ICD-10-CM | POA: Diagnosis not present

## 2018-01-26 DIAGNOSIS — L89154 Pressure ulcer of sacral region, stage 4: Secondary | ICD-10-CM | POA: Diagnosis not present

## 2018-01-26 DIAGNOSIS — Z872 Personal history of diseases of the skin and subcutaneous tissue: Secondary | ICD-10-CM | POA: Diagnosis not present

## 2018-01-29 DIAGNOSIS — R030 Elevated blood-pressure reading, without diagnosis of hypertension: Secondary | ICD-10-CM | POA: Diagnosis not present

## 2018-01-29 DIAGNOSIS — D509 Iron deficiency anemia, unspecified: Secondary | ICD-10-CM | POA: Diagnosis not present

## 2018-01-29 DIAGNOSIS — J189 Pneumonia, unspecified organism: Secondary | ICD-10-CM | POA: Diagnosis not present

## 2018-02-11 DIAGNOSIS — J90 Pleural effusion, not elsewhere classified: Secondary | ICD-10-CM | POA: Diagnosis not present

## 2018-02-11 DIAGNOSIS — Q245 Malformation of coronary vessels: Secondary | ICD-10-CM | POA: Diagnosis not present

## 2018-02-11 DIAGNOSIS — I248 Other forms of acute ischemic heart disease: Secondary | ICD-10-CM | POA: Diagnosis not present

## 2018-02-11 DIAGNOSIS — I509 Heart failure, unspecified: Secondary | ICD-10-CM | POA: Diagnosis not present

## 2018-02-11 DIAGNOSIS — I2693 Single subsegmental pulmonary embolism without acute cor pulmonale: Secondary | ICD-10-CM | POA: Diagnosis not present

## 2018-02-11 DIAGNOSIS — I2510 Atherosclerotic heart disease of native coronary artery without angina pectoris: Secondary | ICD-10-CM | POA: Diagnosis not present

## 2018-02-11 DIAGNOSIS — R911 Solitary pulmonary nodule: Secondary | ICD-10-CM | POA: Diagnosis not present

## 2018-02-11 DIAGNOSIS — R0602 Shortness of breath: Secondary | ICD-10-CM | POA: Diagnosis not present

## 2018-02-11 DIAGNOSIS — I444 Left anterior fascicular block: Secondary | ICD-10-CM | POA: Diagnosis not present

## 2018-02-11 DIAGNOSIS — R918 Other nonspecific abnormal finding of lung field: Secondary | ICD-10-CM | POA: Diagnosis not present

## 2018-02-11 DIAGNOSIS — I110 Hypertensive heart disease with heart failure: Secondary | ICD-10-CM | POA: Diagnosis not present

## 2018-02-11 DIAGNOSIS — I2699 Other pulmonary embolism without acute cor pulmonale: Secondary | ICD-10-CM | POA: Diagnosis not present

## 2018-02-11 DIAGNOSIS — I5020 Unspecified systolic (congestive) heart failure: Secondary | ICD-10-CM | POA: Diagnosis not present

## 2018-02-11 DIAGNOSIS — R9431 Abnormal electrocardiogram [ECG] [EKG]: Secondary | ICD-10-CM | POA: Diagnosis not present

## 2018-02-11 DIAGNOSIS — I493 Ventricular premature depolarization: Secondary | ICD-10-CM | POA: Diagnosis not present

## 2018-02-22 DIAGNOSIS — K8689 Other specified diseases of pancreas: Secondary | ICD-10-CM | POA: Diagnosis not present

## 2018-02-22 DIAGNOSIS — K7689 Other specified diseases of liver: Secondary | ICD-10-CM | POA: Diagnosis not present

## 2018-02-22 DIAGNOSIS — D3A098 Benign carcinoid tumors of other sites: Secondary | ICD-10-CM | POA: Diagnosis not present

## 2018-02-27 DIAGNOSIS — C7A025 Malignant carcinoid tumor of the sigmoid colon: Secondary | ICD-10-CM | POA: Diagnosis not present

## 2018-02-27 DIAGNOSIS — C787 Secondary malignant neoplasm of liver and intrahepatic bile duct: Secondary | ICD-10-CM | POA: Diagnosis not present

## 2018-02-27 DIAGNOSIS — C7A00 Malignant carcinoid tumor of unspecified site: Secondary | ICD-10-CM | POA: Diagnosis not present

## 2018-02-28 DIAGNOSIS — D3A098 Benign carcinoid tumors of other sites: Secondary | ICD-10-CM | POA: Diagnosis not present

## 2018-02-28 DIAGNOSIS — I509 Heart failure, unspecified: Secondary | ICD-10-CM | POA: Diagnosis not present

## 2018-02-28 DIAGNOSIS — C7B8 Other secondary neuroendocrine tumors: Secondary | ICD-10-CM | POA: Diagnosis not present

## 2018-02-28 DIAGNOSIS — C7A012 Malignant carcinoid tumor of the ileum: Secondary | ICD-10-CM | POA: Diagnosis not present

## 2018-03-21 DIAGNOSIS — R001 Bradycardia, unspecified: Secondary | ICD-10-CM | POA: Diagnosis not present

## 2018-03-21 DIAGNOSIS — I25118 Atherosclerotic heart disease of native coronary artery with other forms of angina pectoris: Secondary | ICD-10-CM | POA: Diagnosis not present

## 2018-03-21 DIAGNOSIS — I5022 Chronic systolic (congestive) heart failure: Secondary | ICD-10-CM | POA: Diagnosis not present

## 2018-04-04 DIAGNOSIS — Z79899 Other long term (current) drug therapy: Secondary | ICD-10-CM | POA: Diagnosis not present

## 2018-04-04 DIAGNOSIS — C7A012 Malignant carcinoid tumor of the ileum: Secondary | ICD-10-CM | POA: Diagnosis not present

## 2018-04-04 DIAGNOSIS — I2693 Single subsegmental pulmonary embolism without acute cor pulmonale: Secondary | ICD-10-CM | POA: Diagnosis not present

## 2018-04-04 DIAGNOSIS — I509 Heart failure, unspecified: Secondary | ICD-10-CM | POA: Diagnosis not present

## 2018-04-04 DIAGNOSIS — D3A098 Benign carcinoid tumors of other sites: Secondary | ICD-10-CM | POA: Diagnosis not present

## 2018-04-04 DIAGNOSIS — C787 Secondary malignant neoplasm of liver and intrahepatic bile duct: Secondary | ICD-10-CM | POA: Diagnosis not present

## 2018-05-02 DIAGNOSIS — D3A098 Benign carcinoid tumors of other sites: Secondary | ICD-10-CM | POA: Diagnosis not present

## 2018-05-30 DIAGNOSIS — I2693 Single subsegmental pulmonary embolism without acute cor pulmonale: Secondary | ICD-10-CM | POA: Diagnosis not present

## 2018-05-30 DIAGNOSIS — C787 Secondary malignant neoplasm of liver and intrahepatic bile duct: Secondary | ICD-10-CM | POA: Diagnosis not present

## 2018-05-30 DIAGNOSIS — C7A012 Malignant carcinoid tumor of the ileum: Secondary | ICD-10-CM | POA: Diagnosis not present

## 2018-05-30 DIAGNOSIS — C7A098 Malignant carcinoid tumors of other sites: Secondary | ICD-10-CM | POA: Diagnosis not present

## 2018-05-30 DIAGNOSIS — I509 Heart failure, unspecified: Secondary | ICD-10-CM | POA: Diagnosis not present

## 2018-06-13 DIAGNOSIS — I5022 Chronic systolic (congestive) heart failure: Secondary | ICD-10-CM | POA: Diagnosis not present

## 2018-06-13 DIAGNOSIS — I2510 Atherosclerotic heart disease of native coronary artery without angina pectoris: Secondary | ICD-10-CM | POA: Diagnosis not present

## 2018-06-13 DIAGNOSIS — I428 Other cardiomyopathies: Secondary | ICD-10-CM | POA: Diagnosis not present

## 2018-06-13 DIAGNOSIS — I2693 Single subsegmental pulmonary embolism without acute cor pulmonale: Secondary | ICD-10-CM | POA: Diagnosis not present

## 2018-06-27 DIAGNOSIS — Z5111 Encounter for antineoplastic chemotherapy: Secondary | ICD-10-CM | POA: Diagnosis not present

## 2018-06-27 DIAGNOSIS — C7B02 Secondary carcinoid tumors of liver: Secondary | ICD-10-CM | POA: Diagnosis not present

## 2018-06-27 DIAGNOSIS — C7A012 Malignant carcinoid tumor of the ileum: Secondary | ICD-10-CM | POA: Diagnosis not present

## 2018-07-25 DIAGNOSIS — C7A8 Other malignant neuroendocrine tumors: Secondary | ICD-10-CM | POA: Diagnosis not present

## 2018-07-25 DIAGNOSIS — K7689 Other specified diseases of liver: Secondary | ICD-10-CM | POA: Diagnosis not present

## 2018-07-25 DIAGNOSIS — C7A019 Malignant carcinoid tumor of the small intestine, unspecified portion: Secondary | ICD-10-CM | POA: Diagnosis not present

## 2018-07-25 DIAGNOSIS — I509 Heart failure, unspecified: Secondary | ICD-10-CM | POA: Diagnosis not present

## 2018-07-25 DIAGNOSIS — C7B8 Other secondary neuroendocrine tumors: Secondary | ICD-10-CM | POA: Diagnosis not present

## 2018-07-25 DIAGNOSIS — C7A012 Malignant carcinoid tumor of the ileum: Secondary | ICD-10-CM | POA: Diagnosis not present

## 2018-07-30 DIAGNOSIS — E538 Deficiency of other specified B group vitamins: Secondary | ICD-10-CM | POA: Diagnosis not present

## 2018-08-22 DIAGNOSIS — C7A012 Malignant carcinoid tumor of the ileum: Secondary | ICD-10-CM | POA: Diagnosis not present

## 2018-08-22 DIAGNOSIS — Z5111 Encounter for antineoplastic chemotherapy: Secondary | ICD-10-CM | POA: Diagnosis not present

## 2018-11-16 ENCOUNTER — Encounter: Payer: Self-pay | Admitting: Gastroenterology

## 2019-08-13 IMAGING — CT NM PET TUM IMG INITIAL (PI) SKULL BASE T - THIGH
1 of 8 series · 1 of 25 positions shown · non-contrast
Comparison: CT chest abdomen pelvis dated 09/05/2017

CLINICAL DATA: Initial treatment strategy for colon cancer.

EXAM:
NUCLEAR MEDICINE PET SKULL BASE TO THIGH
TECHNIQUE: 13.4 mCi F-18 FDG was injected intravenously. Full-ring PET imaging
was performed from the skull base to thigh after the radiotracer. CT
data was obtained and used for attenuation correction and anatomic
localization.
Fasting blood glucose: 115 mg/dl

[Series 4: ct sk_thigh 5.0 b31f · axial · 5.0mm · 0.98mm/px · 1 of 236 slices shown]
[im 236/236  brain]
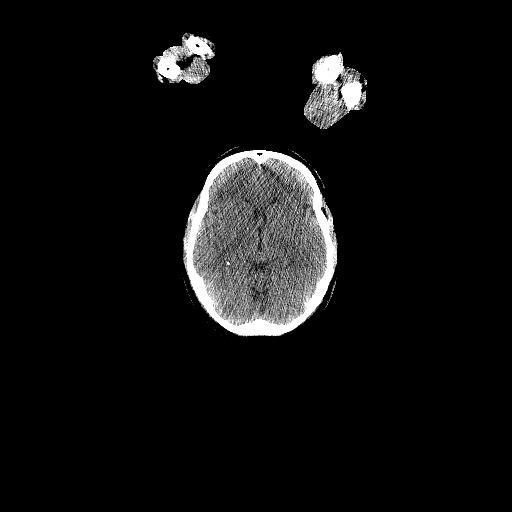

[1 of 25 positions shown; findings below may reference images not displayed]

FINDINGS: Mediastinal blood pool activity: SUV max

NECK: No hypermetabolic cervical lymphadenopathy.

Incidental CT findings: none

CHEST: Hypermetabolic calcified thoracic lymphadenopathy,
nonspecific/indeterminate, including:

--12 mm short axis right paratracheal node (series 4/image 64), max
SUV

--2.1 cm short axis AP window node (series 4/image 64), max SUV

--1.8 cm short axis subcarinal node (series 4/image 35), max SUV

--Hypermetabolism in the right perihilar region, max SUV

Bronchial wall thickening with peribronchovascular nodularity in the
right middle lobe and right lower lobe (series 8/image 38). Two
discrete nodules measuring up to 12 mm in the right lower lobe
(series 8/image 38), max SUV 2.4. Additional 7 mm subpleural nodule
in the lingula, max SUV 0.9.

Incidental CT findings: Cardiomegaly. No pericardial effusion. Mild
atherosclerotic calcifications of the aortic root. Coronary
atherosclerosis of the LAD and right coronary artery.

ABDOMEN/PELVIS: Mild soft tissue prominence in the region the
hepatic flexure (series 4/image 126) with associated
hypermetabolism, max SUV 15.0, possibly corresponding to the
patient's known tubulovillous adenoma with dysplastic features.

4.1 x 3.0 cm calcified mass in the right lower quadrant (series
Adjacent mesenteric nodes measuring up to 15 mm short axis (series
4/image 144). Tethering of adjacent loops of small bowel with mild
small bowel wall thickening (series 4/image 161), and dilated loops
of small bowel proximally, raising concern for some degree of
secondary small bowel obstruction.

Scattered mild abdominopelvic lymphadenopathy, some of which are
hypermetabolic, including:

--12 mm short axis portacaval node (series 4/image 115), max SUV

--12 mm short axis node posterior to the left renal vein (series
4/image 123), max SUV

--9 mm short axis left common iliac node (series 4/image 153), max
SUV

--5 mm short axis right common iliac node (series 4/image 164), max
SUV

--10 mm short axis right obturator node (series 4/image 185), max
SUV

Incidental CT findings: Colon is decompressed. Sigmoid
diverticulosis, without evidence of diverticulitis. Small volume
pelvic ascites. Atherosclerotic calcifications of the abdominal
aorta and branch vessels.

SKELETON: Multifocal hypermetabolic osseous lesions, nonspecific but
worrisome for osseous metastases in this clinical setting,
including:

--Left proximal humerus, max SUV

--Right T10 vertebral body, max SUV

--Right L4 vertebral body, max SUV

--Right sacrum, max SUV

--Right inferior pubic ramus, max SUV

Incidental CT findings: Degenerative changes of the visualized
thoracolumbar spine.
IMPRESSION: 4.1 cm calcified mass in the right lower quadrant, worrisome for
mesenteric carcinoid. Associated mild hypermetabolic abdominopelvic
lymphadenopathy, suspicious for nodal metastases.

Findings suspicious for at least partial small bowel obstruction, as
described above, secondary to the patient's suspected mesenteric
carcinoid.

Additional hypermetabolic lesion at the hepatic flexure of the
colon, possibly corresponding to the patient's known tubulovillous
adenoma with dysplastic features.

Hypermetabolic calcified thoracic lymphadenopathy, technically
indeterminate. This appearance could be secondary to granulomatous
disease such as sarcoid. However, given the additional findings,
nodal metastases are considered the diagnosis of exclusion.

Multifocal hypermetabolic osseous lesions, worrisome for osseous
metastases in this context, including representative lesions
described above.

These results will be called to the ordering clinician or
representative by the Radiologist Assistant, and communication
documented in the PACS or zVision Dashboard.

## 2019-08-19 ENCOUNTER — Encounter: Payer: Self-pay | Admitting: Gastroenterology

## 2019-09-13 ENCOUNTER — Encounter: Payer: Self-pay | Admitting: Gastroenterology

## 2019-09-13 ENCOUNTER — Telehealth: Payer: Self-pay

## 2019-09-13 ENCOUNTER — Other Ambulatory Visit: Payer: Self-pay

## 2019-09-13 ENCOUNTER — Ambulatory Visit (INDEPENDENT_AMBULATORY_CARE_PROVIDER_SITE_OTHER): Payer: BC Managed Care – PPO | Admitting: Gastroenterology

## 2019-09-13 VITALS — BP 150/90 | HR 66 | Temp 97.7°F | Ht 74.0 in | Wt 280.0 lb

## 2019-09-13 DIAGNOSIS — Z859 Personal history of malignant neoplasm, unspecified: Secondary | ICD-10-CM | POA: Diagnosis not present

## 2019-09-13 DIAGNOSIS — D126 Benign neoplasm of colon, unspecified: Secondary | ICD-10-CM

## 2019-09-13 MED ORDER — CLENPIQ 10-3.5-12 MG-GM -GM/160ML PO SOLN: 1.0000 | ORAL | 0 refills | Status: DC

## 2019-09-13 NOTE — Patient Instructions (Addendum)
If you are age 62 or older, your body mass index should be between 23-30. Your Body mass index is 35.95 kg/m. If this is out of the aforementioned range listed, please consider follow up with your Primary Care Provider.  If you are age 44 or younger, your body mass index should be between 19-25. Your Body mass index is 35.95 kg/m. If this is out of the aformentioned range listed, please consider follow up with your Primary Care Provider.   You have been scheduled for a colonoscopy. Please follow written instructions given to you at your visit today.  Please pick up your prep supplies at the pharmacy within the next 1-3 days. If you use inhalers (even only as needed), please bring them with you on the day of your procedure. Your physician has requested that you go to www.startemmi.com and enter the access code given to you at your visit today. This web site gives a general overview about your procedure. However, you should still follow specific instructions given to you by our office regarding your preparation for the procedure.  HOLD IRON FIVE DAYS PRIOR TO PROCEDURE.  You will be contacted by our office prior to your procedure for directions on holding your Xarelto  If you do not hear from our office 1 week prior to your scheduled procedure, please call [phone] to discuss.   Thank you for choosing me and [facility].   Jackquline Denmark, MD

## 2019-09-13 NOTE — Telephone Encounter (Signed)
[facility] [id number] [place], [place]  [address] Phone:  [phone]   Fax:  [phone]   09/13/2019   RE:      Daivd Kitzmann DOB:   Nov 19, 1955 MRN:   [id number]   Dear Dr. Nyra Capes,    We have scheduled the above patient for an endoscopic procedure. Our records show that he is on anticoagulation therapy.   Please advise as to whether the patient may come off his therapy of Xarelto 2 days prior to the colonoscopy procedure, which is scheduled for 10/07/19.  Please fax back/ or route the completed form to [place] at [phone].   Sincerely,    Thurmon Fair, RMA

## 2019-09-13 NOTE — Progress Notes (Signed)
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 PET 05/2019 revealed stable disease. Being followed by Dr Jimmy Footman @ [facility] adenoma with HGD with NET R colon s/p resection as above CHF (resolved). EF 45-50% 06/2019 H/O PE on Xarelto Oct 2019  Plan: - Colon after cardio clerence (off Alen Blew) Dr Lind Guest [facility].  Explained risks and benefits.  This will be scheduled in coming days.  HPI:    Nicholas Mora is a 64 y.o. male  For follow-up visit Doing much better.  No nausea, vomiting, heartburn, regurgitation, odynophagia or dysphagia.  No significant diarrhea or constipation.  No melena or hematochezia. No unintentional weight loss. No abdominal pain.  S/P X-lap with resection of TI/right colon/intraoperative ultrasound of the liver followed by eventual resection left lobe of the liver and cholecystectomy on July 2019 by Dr. Clovis Riley. Bx-well-differentiated NET with perineural invasion, lymphovascular invasion.  Margins were negative.  He also had tubulovillous adenoma with high-grade dysplasia involved by well-differentiated NET in the ascending colon.  Appendix was also informed by NET./16 lymph nodes were positive.  Left lobe wedge resection of the liver positive for NET.  He is currently on lanreotide every monthly.  No diarrhea already symptoms suggestive of carcinoid syndrome.  Being closely followed by oncology at [facility].  Is very pleased with the progress.   Advised to get colonoscopy performed.   Past Medical History:  Diagnosis Date  . Allergy   . History of colon polyps     Past Surgical History:  Procedure Laterality Date  . KNEE ARTHROSCOPY W/ ACL RECONSTRUCTION Left 2010   left knee - mcl     Family History  Problem Relation Age of Onset  .  Stomach cancer Father   . Colon cancer Neg Hx   . Rectal cancer Neg Hx   . Esophageal cancer Neg Hx     Social History   Tobacco Use  . Smoking status: Never Smoker  . Smokeless tobacco: Never Used  Substance Use Topics  . Alcohol use: Never  . Drug use: Never    Current Outpatient Medications  Medication Sig Dispense Refill  . aspirin 81 MG chewable tablet Chew 81 mg by mouth daily.    Marland Kitchen atorvastatin (LIPITOR) 40 MG tablet Take 40 mg by mouth daily.    . carvedilol (COREG) 25 MG tablet Take 25 mg by mouth daily.    . cyanocobalamin 2000 MCG tablet Take 2,000 mcg by mouth daily.    . ferrous sulfate 325 (65 FE) MG tablet Take 325 mg by mouth daily with breakfast.    . furosemide (LASIX) 40 MG tablet Take 40 mg by mouth.    Marland Kitchen lisinopril (ZESTRIL) 20 MG tablet Take 20 mg by mouth daily.    . rivaroxaban (XARELTO) 20 MG TABS tablet Take 20 mg by mouth daily with supper.     Current Facility-Administered Medications  Medication Dose Route Frequency Provider Last Rate Last Admin  . 0.9 %  sodium chloride infusion  500 mL Intravenous Once Jackquline Denmark, MD        No Known Allergies  Review of Systems:  neg     Physical Exam:    BP (!) 150/90   Pulse 66   Temp 97.7 F (36.5 C)   Ht 6'  2" (1.88 m)   Wt 280 lb (127 kg)   BMI 35.95 kg/m  Wt Readings from Last 3 Encounters:  09/13/19 280 lb (127 kg)  10/04/17 265 lb (120.2 kg)  09/26/17 263 lb (119.3 kg)   Constitutional:  Well-developed, in no acute distress. Psychiatric: Normal mood and affect. Behavior is normal. HEENT: Pupils normal.  Conjunctivae are normal. No scleral icterus. Neck supple.  Cardiovascular: Normal rate, regular rhythm. No edema Pulmonary/chest: Effort normal and breath sounds normal. No wheezing, rales or rhonchi. Abdominal: Soft, nondistended. Nontender. Bowel sounds active throughout. There are no masses palpable. No hepatomegaly.  Well-healed surgical scars. Rectal:  defered Neurological:  Alert and oriented to person place and time. Skin: Skin is warm and dry. No rashes noted.  Data Reviewed: I have personally reviewed following labs and imaging studies  CBC: CBC Latest Ref Rng & Units 10/04/2017 08/30/2017  WBC 4.0 - 10.0 K/uL 9.1 6.7  Hemoglobin 13.0 - 17.1 g/dL 12.5(L) 12.7(L)  Hematocrit 38.7 - 49.9 % 38.8 39.0  Platelets 145 - 400 K/uL 279 245.0    CMP: CMP Latest Ref Rng & Units 10/04/2017 08/30/2017  Glucose 73 - 118 mg/dL 114 120(H)  BUN 7 - 22 mg/dL 20 9  Creatinine 0.60 - 1.20 mg/dL 0.80 1.01  Sodium 128 - 145 mmol/L 143 142  Potassium 3.3 - 4.7 mmol/L 4.6 3.9  Chloride 98 - 108 mmol/L 104 98  CO2 18 - 33 mmol/L 28 35(H)  Calcium 8.0 - 10.3 mg/dL 9.3 9.3  Total Protein 6.4 - 8.1 g/dL 6.3(L) 6.3  Total Bilirubin 0.2 - 1.6 mg/dL 0.8 0.6  Alkaline Phos 26 - 84 U/L 84 63  AST 11 - 38 U/L 23 12  ALT 10 - 47 U/L 24 11     Carmell Austria, MD 09/13/2019, 10:30 AM  Cc: Helen Hashimoto., MD

## 2019-09-26 NOTE — Telephone Encounter (Signed)
Spoke with patient today regarding holding Xarelto two days prior to his procedure.  Per Dr. Clovia Cuff okay to hold two days.  Patient verbalized understanding.  Letter from Dr Clovia Cuff scanned to chart.

## 2019-10-07 ENCOUNTER — Encounter: Payer: Self-pay | Admitting: Gastroenterology

## 2019-10-07 ENCOUNTER — Ambulatory Visit (AMBULATORY_SURGERY_CENTER): Payer: BC Managed Care – PPO | Admitting: Gastroenterology

## 2019-10-07 ENCOUNTER — Other Ambulatory Visit: Payer: Self-pay

## 2019-10-07 VITALS — BP 132/66 | HR 63 | Temp 98.4°F | Resp 11 | Ht 74.0 in | Wt 280.0 lb

## 2019-10-07 DIAGNOSIS — K649 Unspecified hemorrhoids: Secondary | ICD-10-CM | POA: Diagnosis not present

## 2019-10-07 DIAGNOSIS — D126 Benign neoplasm of colon, unspecified: Secondary | ICD-10-CM | POA: Diagnosis not present

## 2019-10-07 DIAGNOSIS — K5730 Diverticulosis of large intestine without perforation or abscess without bleeding: Secondary | ICD-10-CM

## 2019-10-07 MED ORDER — SODIUM CHLORIDE 0.9 % IV SOLN: 500.0000 mL | Freq: Once | INTRAVENOUS | Status: DC

## 2019-10-07 NOTE — Patient Instructions (Signed)
HANDOUTS PROVIDED ON: DIVERTICULOSIS & HEMORRHOIDS  You may resume your previous diet and medication schedule.  You may resume your Xarelto today.  Thank you for allowing [place] to care for you today!!!   YOU HAD AN ENDOSCOPIC PROCEDURE TODAY AT [facility]:   Refer to the procedure report that was given to you for any specific questions about what was found during the examination.  If the procedure report does not answer your questions, please call your gastroenterologist to clarify.  If you requested that your care partner not be given the details of your procedure findings, then the procedure report has been included in a sealed envelope for you to review at your convenience later.  YOU SHOULD EXPECT: Some feelings of bloating in the abdomen. Passage of more gas than usual.  Walking can help get rid of the air that was put into your GI tract during the procedure and reduce the bloating. If you had a lower endoscopy (such as a colonoscopy or flexible sigmoidoscopy) you may notice spotting of blood in your stool or on the toilet paper. If you underwent a bowel prep for your procedure, you may not have a normal bowel movement for a few days.  Please Note:  You might notice some irritation and congestion in your nose or some drainage.  This is from the oxygen used during your procedure.  There is no need for concern and it should clear up in a day or so.  SYMPTOMS TO REPORT IMMEDIATELY:   Following lower endoscopy (colonoscopy or flexible sigmoidoscopy):  Excessive amounts of blood in the stool  Significant tenderness or worsening of abdominal pains  Swelling of the abdomen that is new, acute  Fever of 100F or higher  For urgent or emergent issues, a gastroenterologist can be reached at any hour by calling [phone]. Do not use MyChart messaging for urgent concerns.    DIET:  We do recommend a small meal at first, but then you may proceed to your regular diet.  Drink  plenty of fluids but you should avoid alcoholic beverages for 24 hours.  ACTIVITY:  You should plan to take it easy for the rest of today and you should NOT DRIVE or use heavy machinery until tomorrow (because of the sedation medicines used during the test).    FOLLOW UP: Our staff will call the number listed on your records 48-72 hours following your procedure to check on you and address any questions or concerns that you may have regarding the information given to you following your procedure. If we do not reach you, we will leave a message.  We will attempt to reach you two times.  During this call, we will ask if you have developed any symptoms of COVID 19. If you develop any symptoms (ie: fever, flu-like symptoms, shortness of breath, cough etc.) before then, please call [phone].  If you test positive for Covid 19 in the 2 weeks post procedure, please call and report this information to [place].    If any biopsies were taken you will be contacted by phone or by letter within the next 1-3 weeks.  Please call us at [phone] if you have not heard about the biopsies in 3 weeks.    SIGNATURES/CONFIDENTIALITY: You and/or your care partner have signed paperwork which will be entered into your electronic medical record.  These signatures attest to the fact that that the information above on your After Visit Summary has been reviewed and is  understood.  Full responsibility of the confidentiality of this discharge information lies with you and/or your care-partner. 

## 2019-10-07 NOTE — Progress Notes (Signed)
Pt's states no medical or surgical changes since previsit or office visit.  Temp- June Vitals- Courtney 

## 2019-10-07 NOTE — Progress Notes (Signed)
pt tolerated well. VSS. awake and to recovery. Report given to RN.  

## 2019-10-07 NOTE — Op Note (Signed)
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                           TV adenoma with HGD with NET R colon s/p resection. Medicines:                Monitored Anesthesia Care Procedure:                Pre-Anesthesia Assessment:                           - Prior to the procedure, a History and Physical                            was performed, and patient medications and                            allergies were reviewed. The patient's tolerance of                            previous anesthesia was also reviewed. The risks                            and benefits of the procedure and the sedation                            options and risks were discussed with the patient.                            All questions were answered, and informed consent                            was obtained. Prior Anticoagulants: The patient has                            taken Xarelto (rivaroxaban), last dose was 2 days                            prior to procedure. ASA Grade Assessment: III - A  patient with severe systemic disease. After                            reviewing the risks and benefits, the patient was                            deemed in satisfactory condition to undergo the                            procedure.                           After obtaining informed consent, the colonoscope                            was passed under direct vision. Throughout the                            procedure, the  patient's blood pressure, pulse, and                            oxygen saturations were monitored continuously. The                            Colonoscope was introduced through the anus and                            advanced to the the ileocolonic anastomosis. The                            colonoscopy was performed without difficulty. The                            patient tolerated the procedure well. The quality                            of the bowel preparation was good. The ileocolic                            anastomosis and rectum were photographed. Scope In: 3:13:49 PM Scope Out: 3:26:17 PM Scope Withdrawal Time: 0 hours 8 minutes 27 seconds  Total Procedure Duration: 0 hours 12 minutes 28 seconds  Findings:                 Multiple medium-mouthed diverticula were found in                            the sigmoid colon and rare in descending colon.                           Non-bleeding internal hemorrhoids were found during                            retroflexion. The hemorrhoids were small.  The exam was otherwise without abnormality.                            Ileocolonic ileocolonic anastomosis was patent. Complications:            No immediate complications. Estimated Blood Loss:     Estimated blood loss: none. Impression:               - Moderate predominantly sigmoid diverticulosis.                           - Otherwise normal colonoscopy to ileocolic                            anastomosis. No recurrence. Recommendation:           - Patient has a contact number available for                            emergencies. The signs and symptoms of potential                            delayed complications were discussed with the                            patient. Return to normal activities tomorrow.                            Written discharge instructions were provided to the                            patient.                           - Resume  previous diet.                           - Continue present medications.                           - Resume Xarelto (rivaroxaban) at prior dose today.                           - Return to GI clinic PRN.                           - The findings and recommendations were discussed                            with the patient's family. Jackquline Denmark, MD 10/07/2019 3:36:20 PM This report has been signed electronically.

## 2019-10-09 ENCOUNTER — Telehealth: Payer: Self-pay

## 2019-10-09 NOTE — Telephone Encounter (Signed)
  Follow up Call-  Call back number 10/07/2019 08/30/2017  Post procedure Call Back phone  # [phone]  Permission to leave phone message Yes Yes     Patient questions:  Do you have a fever, pain , or abdominal swelling? No. Pain Score  0 *  Have you tolerated food without any problems? Yes.    Have you been able to return to your normal activities? Yes.    Do you have any questions about your discharge instructions: Diet   No. Medications  No. Follow up visit  No.  Do you have questions or concerns about your Care? No.  Actions: * If pain score is 4 or above: No action needed, pain <4.   1. Have you developed a fever since your procedure? no  2.   Have you had an respiratory symptoms (SOB or cough) since your procedure? no  3.   Have you tested positive for COVID 19 since your procedure no  4.   Have you had any family members/close contacts diagnosed with the COVID 19 since your procedure?  no   If yes to any of these questions please route to Joylene Delvon, RN and Erenest Rasher, RN

## 2022-10-20 ENCOUNTER — Encounter: Payer: Self-pay | Admitting: Gastroenterology

## 2023-10-02 ENCOUNTER — Telehealth: Payer: Self-pay | Admitting: *Deleted

## 2023-10-02 NOTE — Telephone Encounter (Signed)
 Copied from CRM [id number]. Topic: Clinical - Medical Advice >> Sep 29, 2023  2:45 PM Isabell A wrote: Reason for CRM: Jonothan Neve, PT from [facility] - states pt has an 8 lb weight gain from 6/4 to today, just calling to inform Dr.Byrum.   Callback number: [phone]  Called and spoke with Jonothan Neve, PT with [facility]  He states that this msg was intended for pt's cardiologist He will reach out to pt to get the name of his cardiologist and will reach ou to them  Nothing further needed

## 2024-03-25 DEATH — deceased
